# Patient Record
Sex: Female | Born: 2005 | Race: White | Hispanic: No | Marital: Single | Smoking: Never smoker
Health system: Southern US, Community
[De-identification: ages and names within clinical notes are randomized; demographics above are authoritative.]

## PROBLEM LIST (undated history)

## (undated) DIAGNOSIS — E063 Autoimmune thyroiditis: Secondary | ICD-10-CM

## (undated) DIAGNOSIS — H539 Unspecified visual disturbance: Secondary | ICD-10-CM

## (undated) HISTORY — DX: Autoimmune thyroiditis: E06.3

---
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
## 2023-03-03 ENCOUNTER — Ambulatory Visit
Admission: EM | Admit: 2023-03-03 | Discharge: 2023-03-03 | Disposition: A | Discharge status: Home / Self Care | Payer: Commercial Managed Care - PPO | Attending: Physician Assistant | Admitting: Physician Assistant

## 2023-03-03 DIAGNOSIS — R350 Frequency of micturition: Secondary | ICD-10-CM

## 2023-03-03 DIAGNOSIS — N3000 Acute cystitis without hematuria: Secondary | ICD-10-CM | POA: Diagnosis not present

## 2023-03-03 LAB — URINALYSIS, W/ REFLEX TO CULTURE (INFECTION SUSPECTED)
Bilirubin Urine: NEGATIVE
Glucose, UA: NEGATIVE mg/dL
Nitrite: POSITIVE — AB
Protein, ur: NEGATIVE mg/dL
Specific Gravity, Urine: >1.03 — ABNORMAL HIGH (ref 1.005–1.030)
WBC, UA: >50 WBC/hpf (ref 0–5)
pH: 5.5 (ref 5.0–8.0)

## 2023-03-03 LAB — WET PREP, GENITAL
Sperm: NONE SEEN
Trich, Wet Prep: NONE SEEN
WBC, Wet Prep HPF POC: >=10 — AB (ref <10)
Yeast Wet Prep HPF POC: NONE SEEN

## 2023-03-03 MED ORDER — NITROFURANTOIN MONOHYD MACRO 100 MG PO CAPS: 100.0000 mg | ORAL_CAPSULE | Freq: Two times a day (BID) | ORAL | 0 refills | Status: DC

## 2023-03-03 MED ORDER — METRONIDAZOLE 500 MG PO TABS: 500.0000 mg | ORAL_TABLET | Freq: Two times a day (BID) | ORAL | 0 refills | Status: AC
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
## 2023-03-03 NOTE — ED Triage Notes (Signed)
Pt c/o urinary frequency, urgency, and itching x3days  Pt states that she is having Vaginal odor.

## 2023-03-03 NOTE — Discharge Instructions (Addendum)
UTI: Based on either symptoms or urinalysis, you may have a urinary tract infection. We will send the urine for culture and call with results in a few days. Begin antibiotics at this time. Your symptoms should be much improved over the next 2-3 days. Increase rest and fluid intake. If for some reason symptoms are worsening or not improving after a couple of days or the urine culture determines the antibiotics you are taking will not treat the infection, the antibiotics may be changed. Return or go to ER for fever, back pain, worsening urinary pain, discharge, increased blood in urine. May take Tylenol or Motrin OTC for pain relief or consider AZO if no contraindications   The most common types of vaginal infections are yeast infections and bacterial vaginosis. Neither of which are really considered to be sexually transmitted. Often a pH swab or wet prep is performed and if abnormal may reveal either type of infection. Begin metronidazole if prescribed for possible BV infection. If there is concern for yeast infection, fluconazole is often prescribed . Take this as directed. You may also apply topical miconazole (can be purchased OTC) externally for relief of itching. Increase rest and fluid intake. If labs sent out, we will call within 2-5 days with results and amend treatment if necessary. Always try to use pH balanced washes/wipes, urinate after intercourse, stay hydrated, and take probiotics if you are prone to vaginal infections. Return or see PCP or gynecologist for new/worsening infections.   

## 2023-03-05 LAB — URINE CULTURE: Culture: 40000 — AB

## 2023-03-07 ENCOUNTER — Encounter (INDEPENDENT_AMBULATORY_CARE_PROVIDER_SITE_OTHER): Payer: Self-pay | Admitting: Family

## 2023-03-07 ENCOUNTER — Ambulatory Visit (INDEPENDENT_AMBULATORY_CARE_PROVIDER_SITE_OTHER): Payer: Commercial Managed Care - PPO | Admitting: Family

## 2023-03-07 VITALS — BP 118/76 | HR 74 | Wt 144.8 lb

## 2023-03-07 DIAGNOSIS — R7989 Other specified abnormal findings of blood chemistry: Secondary | ICD-10-CM | POA: Diagnosis not present

## 2023-03-07 DIAGNOSIS — E063 Autoimmune thyroiditis: Secondary | ICD-10-CM

## 2023-03-07 NOTE — Patient Instructions (Signed)
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
## 2023-03-08 LAB — TSH: TSH: 3.82 mIU/L

## 2023-03-08 LAB — T4, FREE: Free T4: 1.4 ng/dL (ref 0.8–1.4)

## 2023-03-08 LAB — T4: T4, Total: 11.9 ug/dL — ABNORMAL HIGH (ref 5.3–11.7)

## 2023-03-12 ENCOUNTER — Other Ambulatory Visit (INDEPENDENT_AMBULATORY_CARE_PROVIDER_SITE_OTHER): Payer: Self-pay | Admitting: Family

## 2023-03-12 MED ORDER — LEVOTHYROXINE SODIUM 75 MCG PO TABS: 75.0000 ug | ORAL_TABLET | Freq: Every day | ORAL | 5 refills | Status: DC

## 2023-09-09 ENCOUNTER — Other Ambulatory Visit (INDEPENDENT_AMBULATORY_CARE_PROVIDER_SITE_OTHER): Payer: Self-pay | Admitting: Family

## 2023-09-11 ENCOUNTER — Ambulatory Visit (INDEPENDENT_AMBULATORY_CARE_PROVIDER_SITE_OTHER): Payer: Commercial Managed Care - PPO | Admitting: Family
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
## 2023-09-25 ENCOUNTER — Other Ambulatory Visit (INDEPENDENT_AMBULATORY_CARE_PROVIDER_SITE_OTHER): Payer: Self-pay | Admitting: Family

## 2023-09-25 NOTE — Telephone Encounter (Signed)
Who's calling (name and relationship to patient) : Kelly [name]; self  Best contact number: Mychart   Provider they see: Dalbert Garnet  Reason for call: [name] wanted to know if she will still get Rx refill until scheduled appt(11/27/23).

## 2023-10-02 ENCOUNTER — Other Ambulatory Visit (INDEPENDENT_AMBULATORY_CARE_PROVIDER_SITE_OTHER): Payer: Self-pay | Admitting: Family

## 2023-10-02 MED ORDER — LEVOTHYROXINE SODIUM 75 MCG PO TABS: ORAL_TABLET | ORAL | 1 refills | Status: DC

## 2023-11-27 ENCOUNTER — Encounter (INDEPENDENT_AMBULATORY_CARE_PROVIDER_SITE_OTHER): Payer: Self-pay | Admitting: Family

## 2023-11-27 ENCOUNTER — Ambulatory Visit (INDEPENDENT_AMBULATORY_CARE_PROVIDER_SITE_OTHER): Payer: Commercial Managed Care - PPO | Admitting: Family

## 2023-11-27 VITALS — BP 120/70 | HR 88 | Ht 59.49 in | Wt 134.8 lb

## 2023-11-27 DIAGNOSIS — E063 Autoimmune thyroiditis: Secondary | ICD-10-CM | POA: Diagnosis not present

## 2023-11-27 DIAGNOSIS — R7989 Other specified abnormal findings of blood chemistry: Secondary | ICD-10-CM
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
## 2023-11-27 NOTE — Patient Instructions (Signed)

## 2023-11-28 ENCOUNTER — Encounter (INDEPENDENT_AMBULATORY_CARE_PROVIDER_SITE_OTHER): Payer: Self-pay

## 2023-11-28 ENCOUNTER — Other Ambulatory Visit (INDEPENDENT_AMBULATORY_CARE_PROVIDER_SITE_OTHER): Payer: Self-pay | Admitting: Family

## 2023-11-28 LAB — T4, FREE: Free T4: 1.4 ng/dL (ref 0.8–1.4)

## 2023-11-28 LAB — TSH: TSH: 2.74 m[IU]/L

## 2023-11-28 MED ORDER — LEVOTHYROXINE SODIUM 75 MCG PO TABS: ORAL_TABLET | ORAL | 5 refills | Status: DC

## 2024-01-29 ENCOUNTER — Encounter (INDEPENDENT_AMBULATORY_CARE_PROVIDER_SITE_OTHER): Payer: Self-pay

## 2024-02-11 ENCOUNTER — Encounter (INDEPENDENT_AMBULATORY_CARE_PROVIDER_SITE_OTHER): Payer: Self-pay

## 2024-03-04 ENCOUNTER — Telehealth (INDEPENDENT_AMBULATORY_CARE_PROVIDER_SITE_OTHER): Payer: Self-pay | Admitting: Family

## 2024-03-04 DIAGNOSIS — E063 Autoimmune thyroiditis: Secondary | ICD-10-CM

## 2024-03-04 NOTE — Telephone Encounter (Signed)
 Kelly [name] will need an adult Endo referral

## 2024-03-25 NOTE — Telephone Encounter (Signed)
 Attempted to call Kelly [name] unable to leave message due to phone call not going through

## 2024-03-28 NOTE — Addendum Note (Signed)
 Addended by: Anjolaoluwa Siguenza on: 03/28/2024 01:44 PM   Modules accepted: Orders

## 2024-03-28 NOTE — Telephone Encounter (Signed)
 Called Kelly [name] to see where she wanted to be referred too. [name] stated she does not know where any are. I let her know the facilities with endo and she stated she would like to go to [place]. Referral placed

## 2024-05-27 ENCOUNTER — Ambulatory Visit (INDEPENDENT_AMBULATORY_CARE_PROVIDER_SITE_OTHER): Payer: Commercial Managed Care - PPO | Admitting: Family

## 2024-08-12 ENCOUNTER — Ambulatory Visit (INDEPENDENT_AMBULATORY_CARE_PROVIDER_SITE_OTHER): Admitting: Endocrinology

## 2024-08-12 ENCOUNTER — Other Ambulatory Visit

## 2024-08-12 ENCOUNTER — Encounter: Payer: Self-pay | Admitting: Endocrinology

## 2024-08-12 VITALS — BP 108/62 | HR 78 | Resp 20 | Ht 59.5 in | Wt 146.2 lb

## 2024-08-12 DIAGNOSIS — E063 Autoimmune thyroiditis: Secondary | ICD-10-CM | POA: Diagnosis not present

## 2024-08-12 LAB — T4, FREE: Free T4: 1.4 ng/dL (ref 0.8–1.4)

## 2024-08-12 LAB — TSH: TSH: 2.09 m[IU]/L
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
## 2024-08-13 ENCOUNTER — Ambulatory Visit: Payer: Self-pay | Admitting: Endocrinology

## 2024-08-13 MED ORDER — LEVOTHYROXINE SODIUM 100 MCG PO TABS: 100.0000 ug | ORAL_TABLET | Freq: Every day | ORAL | 3 refills | Status: AC

## 2024-08-13 NOTE — Addendum Note (Signed)
 Addended by: Denzal Meir, IRAQ on: 08/13/2024 05:17 PM   Modules accepted: Orders

## 2024-10-13 ENCOUNTER — Other Ambulatory Visit

## 2024-10-21 ENCOUNTER — Ambulatory Visit: Admitting: Endocrinology
